# Patient Record
Sex: Male | Born: 1970
Health system: Southern US, Community
[De-identification: ages and names within clinical notes are randomized; demographics above are authoritative.]

## PROBLEM LIST (undated history)

## (undated) DIAGNOSIS — W3400XA Accidental discharge from unspecified firearms or gun, initial encounter: Secondary | ICD-10-CM

---

## 1989-10-17 DIAGNOSIS — W3400XA Accidental discharge from unspecified firearms or gun, initial encounter: Secondary | ICD-10-CM

## 1989-10-17 HISTORY — DX: Accidental discharge from unspecified firearms or gun, initial encounter: W34.00XA

## 2017-06-02 ENCOUNTER — Emergency Department (HOSPITAL_COMMUNITY): Payer: Self-pay

## 2017-06-02 ENCOUNTER — Emergency Department (HOSPITAL_COMMUNITY)
Admission: EM | Admit: 2017-06-02 | Discharge: 2017-06-02 | Disposition: A | Discharge status: Home / Self Care | Payer: Self-pay | Attending: Emergency Medicine | Admitting: Emergency Medicine

## 2017-06-02 DIAGNOSIS — M1711 Unilateral primary osteoarthritis, right knee: Secondary | ICD-10-CM | POA: Insufficient documentation

## 2017-06-02 DIAGNOSIS — M25461 Effusion, right knee: Secondary | ICD-10-CM | POA: Insufficient documentation

## 2017-06-02 MED ORDER — HYDROCODONE-ACETAMINOPHEN 5-325 MG PO TABS
1.0000 | ORAL_TABLET | Freq: Once | ORAL | Status: AC
  Administered 2017-06-02: 1 via ORAL
  Filled 2017-06-02: qty 1

## 2017-06-02 MED ORDER — HYDROCODONE-ACETAMINOPHEN 5-325 MG PO TABS: ORAL_TABLET | ORAL | 0 refills | Status: DC

## 2017-06-02 MED ORDER — IBUPROFEN 200 MG PO TABS
600.0000 mg | ORAL_TABLET | Freq: Once | ORAL | Status: AC
  Administered 2017-06-02: 600 mg via ORAL
  Filled 2017-06-02: qty 3

## 2017-06-02 NOTE — Discharge Instructions (Signed)
For pain control please take ibuprofen (also known as Motrin or Advil) 800mg (this is normally 4 over the counter pills) 3 times a day  for 5 days. Take with food to minimize stomach irritation. ° °Take vicodin for breakthrough pain, do not drink alcohol, drive, care for children or do other critical tasks while taking vicodin. ° °Do not hesitate to return to the Emergency Department for any new, worsening or concerning symptoms.  ° °If you do not have a primary care doctor you can establish one at the  ° °[facility]: °[address] °[place] [address] °[phone] ° °After you establish care. Let them know you were seen in the emergency room. They must obtain records for further management.  ° ° ° °

## 2017-06-02 NOTE — ED Notes (Signed)
Discharge instructions reviewed with patient. Patient verbalizes understanding. VSS.  Patient discharged with knee sleeve in place and with crutches.

## 2017-06-02 NOTE — ED Triage Notes (Signed)
Pt states that he has been having rt knee pain since last night at work.  Worse this morning.  Denies any injury.

## 2017-06-02 NOTE — ED Provider Notes (Signed)
[facility]-EMERGENCY DEPT Provider Note   CSN: [id number] Arrival date & time: 06/02/17  1606     History   Chief Complaint Chief Complaint  Patient presents with  . Knee Pain     HPI   Blood pressure (!) 160/91, pulse 86, temperature 99.1 F (37.3 C), temperature source Oral, resp. rate 18, SpO2 98 %.   Travis Livingston is a 46 y.o. male complaining ofAtraumatic knee pain worsening over the course of the day, taken over-the-counter pain medications with little relief he states the area is swollen but not read. No prior similar exacerbations. He has difficulty ambulating on the knee secondary to pain.   No past medical history on file.  There are no active problems to display for this patient.   No past surgical history on file.     Home Medications    Prior to Admission medications   Medication Sig Start Date End Date Taking? Authorizing Provider  HYDROcodone-acetaminophen (NORCO/VICODIN) 5-325 MG tablet Take 1-2 tablets by mouth every 6 hours as needed for pain. 06/02/17   Travis Livingston, Travis Livingston    Family History No family history on file.  Social History Social History  Substance Use Topics  . Smoking status: Not on file  . Smokeless tobacco: Not on file  . Alcohol use Not on file     Allergies   Patient has no allergy information on record.   Review of Systems Review of Systems  A complete review of systems was obtained and all systems are negative except as noted in the HPI and PMH.    Physical Exam Updated Vital Signs BP (!) 160/91 (BP Location: Left Arm)   Pulse 86   Temp 99.1 F (37.3 C) (Oral)   Resp 18   SpO2 98%   Physical Exam  Constitutional: He is oriented to person, place, and time. He appears well-developed and well-nourished. No distress.  HENT:  Head: Normocephalic and atraumatic.  Mouth/Throat: Oropharynx is clear and moist.  Eyes: Pupils are equal, round, and reactive to light. Conjunctivae and EOM are normal.  Neck: Normal  range of motion.  Cardiovascular: Normal rate, regular rhythm and intact distal pulses.   Pulmonary/Chest: Effort normal and breath sounds normal.  Abdominal: Soft. There is no tenderness.  Musculoskeletal: Normal range of motion. He exhibits edema.  Right knee:  No deformity, erythema or abrasions. Significant effusion with crepitance, very minimally reduced range of motion secondary to pain. No warmth. Anterior and posterior drawer show no abnormal laxity. Stable to valgus and varus stress. Joint lines are non-tender. Neurovascularly intact. Pt ambulates with antalgic gait.    Neurological: He is alert and oriented to person, place, and time.  Skin: He is not diaphoretic.  Psychiatric: He has a normal mood and affect.  Nursing note and vitals reviewed.    ED Treatments / Results  Labs (all labs ordered are listed, but only abnormal results are displayed) Labs Reviewed - No data to display  EKG  EKG Interpretation None       Radiology Dg Knee Complete 4 Views Right  Result Date: 06/02/2017 CLINICAL DATA:  Right knee pain. EXAM: RIGHT KNEE - COMPLETE 4+ VIEW COMPARISON:  No recent . FINDINGS: Moderate knee joint effusion. Tricompartment degenerative change present. Corticated bony density noted adjacent to the medial femoral condyles most likely from old ligamentous injury. IMPRESSION: 1.  Moderate knee joint effusion. 2. Tricompartment degenerative change. Corticated bony density noted adjacent to the medial femoral condyle most likely from old ligamentous  injury. Electronically Signed   By: Maisie Fus  Register   On: 06/02/2017 17:25    Procedures Procedures (including critical care time)  Medications Ordered in ED Medications  HYDROcodone-acetaminophen (NORCO/VICODIN) 5-325 MG per tablet 1 tablet (not administered)  ibuprofen (ADVIL,MOTRIN) tablet 600 mg (not administered)     Initial Impression / Assessment and Plan / ED Course  I have reviewed the triage vital signs and  the nursing notes.  Pertinent labs & imaging results that were available during my care of the patient were reviewed by me and considered in my medical decision making (see chart for details).     Vitals:   06/02/17 1630  BP: (!) 160/91  Pulse: 86  Resp: 18  Temp: 99.1 F (37.3 C)  TempSrc: Oral  SpO2: 98%    Medications  HYDROcodone-acetaminophen (NORCO/VICODIN) 5-325 MG per tablet 1 tablet (not administered)  ibuprofen (ADVIL,MOTRIN) tablet 600 mg (not administered)    Travis Livingston is 46 y.o. male presenting with Pain and effusion to her right knee, likely reactive secondary to osteoarthritis. No warmth or significant reduced range of motion to suggest a septic joint. Offered arthrocentesis for comfort; the patient declined. Patient will be given knee sleeve, crutches, work note and orthopedic referral.  Evaluation does not show pathology that would require ongoing emergent intervention or inpatient treatment. Pt is hemodynamically stable and mentating appropriately. Discussed findings and plan with patient/guardian, who agrees with care plan. All questions answered. Return precautions discussed and outpatient follow up given.      Final Clinical Impressions(s) / ED Diagnoses   Final diagnoses:  Arthritis of right knee  Effusion of right knee joint    New Prescriptions New Prescriptions   HYDROCODONE-ACETAMINOPHEN (NORCO/VICODIN) 5-325 MG TABLET    Take 1-2 tablets by mouth every 6 hours as needed for pain.     Kaylyn Lim 06/02/17 1809    Mesner, Barbara Cower, MD 06/02/17 [phone]

## 2017-07-27 ENCOUNTER — Encounter (HOSPITAL_COMMUNITY): Payer: Self-pay | Admitting: Radiology

## 2017-07-27 ENCOUNTER — Emergency Department (HOSPITAL_COMMUNITY)
Admission: EM | Admit: 2017-07-27 | Discharge: 2017-07-28 | Disposition: A | Discharge status: Home / Self Care | Payer: No Typology Code available for payment source | Attending: Emergency Medicine | Admitting: Emergency Medicine

## 2017-07-27 ENCOUNTER — Emergency Department (HOSPITAL_COMMUNITY): Payer: No Typology Code available for payment source

## 2017-07-27 DIAGNOSIS — R109 Unspecified abdominal pain: Secondary | ICD-10-CM | POA: Insufficient documentation

## 2017-07-27 DIAGNOSIS — M542 Cervicalgia: Secondary | ICD-10-CM | POA: Insufficient documentation

## 2017-07-27 DIAGNOSIS — R202 Paresthesia of skin: Secondary | ICD-10-CM | POA: Diagnosis not present

## 2017-07-27 DIAGNOSIS — R200 Anesthesia of skin: Secondary | ICD-10-CM | POA: Insufficient documentation

## 2017-07-27 DIAGNOSIS — R079 Chest pain, unspecified: Secondary | ICD-10-CM | POA: Diagnosis not present

## 2017-07-27 LAB — COMPREHENSIVE METABOLIC PANEL
ALK PHOS: 75 U/L (ref 38–126)
ALT: 24 U/L (ref 17–63)
ANION GAP: 10 (ref 5–15)
AST: 32 U/L (ref 15–41)
Albumin: 3.8 g/dL (ref 3.5–5.0)
BUN: 10 mg/dL (ref 6–20)
CALCIUM: 8.9 mg/dL (ref 8.9–10.3)
CO2: 25 mmol/L (ref 22–32)
Chloride: 103 mmol/L (ref 101–111)
Creatinine, Ser: 1 mg/dL (ref 0.61–1.24)
GFR calc non Af Amer: >60 mL/min (ref >60)
Glucose, Bld: 92 mg/dL (ref 65–99)
POTASSIUM: 3.1 mmol/L — AB (ref 3.5–5.1)
SODIUM: 138 mmol/L (ref 135–145)
Total Bilirubin: 0.4 mg/dL (ref 0.3–1.2)
Total Protein: 6.6 g/dL (ref 6.5–8.1)

## 2017-07-27 LAB — CBC
HCT: 36.3 % — ABNORMAL LOW (ref 39.0–52.0)
HEMOGLOBIN: 13.2 g/dL (ref 13.0–17.0)
MCH: 30.1 pg (ref 26.0–34.0)
MCHC: 36.4 g/dL — ABNORMAL HIGH (ref 30.0–36.0)
MCV: 82.7 fL (ref 78.0–100.0)
Platelets: 272 10*3/uL (ref 150–400)
RBC: 4.39 MIL/uL (ref 4.22–5.81)
RDW: 14 % (ref 11.5–15.5)
WBC: 6.5 10*3/uL (ref 4.0–10.5)

## 2017-07-27 LAB — URINALYSIS, ROUTINE W REFLEX MICROSCOPIC
BILIRUBIN URINE: NEGATIVE
Glucose, UA: NEGATIVE mg/dL
Hgb urine dipstick: NEGATIVE
KETONES UR: NEGATIVE mg/dL
Leukocytes, UA: NEGATIVE
NITRITE: NEGATIVE
PROTEIN: NEGATIVE mg/dL
Specific Gravity, Urine: 1.015 (ref 1.005–1.030)
pH: 8 (ref 5.0–8.0)

## 2017-07-27 LAB — I-STAT CHEM 8, ED
BUN: 10 mg/dL (ref 6–20)
CALCIUM ION: 1.05 mmol/L — AB (ref 1.15–1.40)
CHLORIDE: 102 mmol/L (ref 101–111)
Creatinine, Ser: 0.9 mg/dL (ref 0.61–1.24)
GLUCOSE: 94 mg/dL (ref 65–99)
HCT: 39 % (ref 39.0–52.0)
Hemoglobin: 13.3 g/dL (ref 13.0–17.0)
Potassium: 3 mmol/L — ABNORMAL LOW (ref 3.5–5.1)
Sodium: 142 mmol/L (ref 135–145)
TCO2: 24 mmol/L (ref 22–32)

## 2017-07-27 LAB — PROTIME-INR
INR: 0.98
PROTHROMBIN TIME: 12.9 s (ref 11.4–15.2)

## 2017-07-27 LAB — ETHANOL

## 2017-07-27 MED ORDER — FENTANYL CITRATE (PF) 100 MCG/2ML IJ SOLN
50.0000 ug | Freq: Once | INTRAMUSCULAR | Status: AC
  Administered 2017-07-27: 50 ug via INTRAVENOUS
  Filled 2017-07-27: qty 2

## 2017-07-27 MED ORDER — IOPAMIDOL (ISOVUE-300) INJECTION 61%
INTRAVENOUS | Status: AC
  Administered 2017-07-27: 100 mL
  Filled 2017-07-27: qty 100

## 2017-07-27 MED ORDER — HYDROCODONE-ACETAMINOPHEN 5-325 MG PO TABS: ORAL_TABLET | ORAL | 0 refills | Status: AC
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
## 2018-06-17 ENCOUNTER — Encounter (HOSPITAL_COMMUNITY): Admission: EM | Disposition: A | Discharge status: Home / Self Care | Payer: Self-pay | Source: Home / Self Care

## 2018-06-17 ENCOUNTER — Inpatient Hospital Stay (HOSPITAL_COMMUNITY)
Admission: EM | Admit: 2018-06-17 | Discharge: 2018-06-19 | DRG: 481 | Disposition: A | Discharge status: Home / Self Care | Payer: Self-pay | Attending: Surgery | Admitting: Surgery

## 2018-06-17 ENCOUNTER — Emergency Department (HOSPITAL_COMMUNITY): Payer: Self-pay

## 2018-06-17 ENCOUNTER — Inpatient Hospital Stay (HOSPITAL_COMMUNITY): Payer: Self-pay | Admitting: Certified Registered Nurse Anesthetist

## 2018-06-17 ENCOUNTER — Inpatient Hospital Stay (HOSPITAL_COMMUNITY): Payer: Self-pay

## 2018-06-17 ENCOUNTER — Other Ambulatory Visit: Payer: Self-pay

## 2018-06-17 ENCOUNTER — Encounter (HOSPITAL_COMMUNITY): Payer: Self-pay | Admitting: *Deleted

## 2018-06-17 DIAGNOSIS — R402252 Coma scale, best verbal response, oriented, at arrival to emergency department: Secondary | ICD-10-CM | POA: Diagnosis present

## 2018-06-17 DIAGNOSIS — S71132A Puncture wound without foreign body, left thigh, initial encounter: Secondary | ICD-10-CM

## 2018-06-17 DIAGNOSIS — F17200 Nicotine dependence, unspecified, uncomplicated: Secondary | ICD-10-CM | POA: Diagnosis present

## 2018-06-17 DIAGNOSIS — W3400XA Accidental discharge from unspecified firearms or gun, initial encounter: Secondary | ICD-10-CM

## 2018-06-17 DIAGNOSIS — S72352B Displaced comminuted fracture of shaft of left femur, initial encounter for open fracture type I or II: Principal | ICD-10-CM | POA: Diagnosis present

## 2018-06-17 DIAGNOSIS — D62 Acute posthemorrhagic anemia: Secondary | ICD-10-CM | POA: Diagnosis not present

## 2018-06-17 DIAGNOSIS — R402142 Coma scale, eyes open, spontaneous, at arrival to emergency department: Secondary | ICD-10-CM | POA: Diagnosis present

## 2018-06-17 DIAGNOSIS — Y92810 Car as the place of occurrence of the external cause: Secondary | ICD-10-CM

## 2018-06-17 DIAGNOSIS — Z09 Encounter for follow-up examination after completed treatment for conditions other than malignant neoplasm: Secondary | ICD-10-CM

## 2018-06-17 DIAGNOSIS — R402362 Coma scale, best motor response, obeys commands, at arrival to emergency department: Secondary | ICD-10-CM | POA: Diagnosis present

## 2018-06-17 HISTORY — PX: INTRAMEDULLARY (IM) NAIL INTERTROCHANTERIC: SHX5875

## 2018-06-17 HISTORY — DX: Accidental discharge from unspecified firearms or gun, initial encounter: W34.00XA

## 2018-06-17 LAB — TYPE AND SCREEN
ABO/RH(D): O POS
Antibody Screen: NEGATIVE
UNIT DIVISION: 0
Unit division: 0

## 2018-06-17 LAB — I-STAT CHEM 8, ED
BUN: 9 mg/dL (ref 6–20)
CALCIUM ION: 1.04 mmol/L — AB (ref 1.15–1.40)
Chloride: 103 mmol/L (ref 98–111)
Creatinine, Ser: 1.5 mg/dL — ABNORMAL HIGH (ref 0.61–1.24)
Glucose, Bld: 174 mg/dL — ABNORMAL HIGH (ref 70–99)
HEMATOCRIT: 37 % — AB (ref 39.0–52.0)
HEMOGLOBIN: 12.6 g/dL — AB (ref 13.0–17.0)
Potassium: 3 mmol/L — ABNORMAL LOW (ref 3.5–5.1)
SODIUM: 140 mmol/L (ref 135–145)
TCO2: 20 mmol/L — ABNORMAL LOW (ref 22–32)

## 2018-06-17 LAB — BPAM RBC
BLOOD PRODUCT EXPIRATION DATE: 201909202359
Blood Product Expiration Date: 201909232359
ISSUE DATE / TIME: 201909010333
ISSUE DATE / TIME: 201909010333
UNIT TYPE AND RH: 9500
Unit Type and Rh: 9500

## 2018-06-17 LAB — PREPARE FRESH FROZEN PLASMA
UNIT DIVISION: 0
Unit division: 0

## 2018-06-17 LAB — BPAM FFP
BLOOD PRODUCT EXPIRATION DATE: 201909022359
BLOOD PRODUCT EXPIRATION DATE: 201909032359
ISSUE DATE / TIME: 201909010335
ISSUE DATE / TIME: 201909010335
UNIT TYPE AND RH: 600
Unit Type and Rh: 6200

## 2018-06-17 LAB — BLOOD PRODUCT ORDER (VERBAL) VERIFICATION

## 2018-06-17 LAB — CDS SEROLOGY

## 2018-06-17 LAB — ABO/RH: ABO/RH(D): O POS

## 2018-06-17 LAB — MRSA PCR SCREENING: MRSA BY PCR: NEGATIVE

## 2018-06-17 LAB — I-STAT CG4 LACTIC ACID, ED: Lactic Acid, Venous: 6.46 mmol/L (ref 0.5–1.9)

## 2018-06-17 SURGERY — FIXATION, FRACTURE, INTERTROCHANTERIC, WITH INTRAMEDULLARY ROD
Anesthesia: General | Laterality: Left

## 2018-06-17 MED ORDER — PHENOL 1.4 % MT LIQD: 1.0000 | OROMUCOSAL | Status: DC | PRN

## 2018-06-17 MED ORDER — FENTANYL CITRATE (PF) 100 MCG/2ML IJ SOLN
INTRAMUSCULAR | Status: AC
  Filled 2018-06-17: qty 2

## 2018-06-17 MED ORDER — SUGAMMADEX SODIUM 200 MG/2ML IV SOLN
INTRAVENOUS | Status: DC | PRN
  Administered 2018-06-17: 170 mg via INTRAVENOUS

## 2018-06-17 MED ORDER — SODIUM CHLORIDE 0.9 % IV SOLN
INTRAVENOUS | Status: DC
  Administered 2018-06-17 – 2018-06-18 (×4): via INTRAVENOUS

## 2018-06-17 MED ORDER — HYDROMORPHONE HCL 1 MG/ML IJ SOLN
1.0000 mg | INTRAMUSCULAR | Status: DC | PRN
  Administered 2018-06-17 – 2018-06-18 (×4): 1 mg via INTRAVENOUS
  Filled 2018-06-17 (×4): qty 1

## 2018-06-17 MED ORDER — CEFAZOLIN SODIUM-DEXTROSE 2-4 GM/100ML-% IV SOLN
2.0000 g | Freq: Four times a day (QID) | INTRAVENOUS | Status: AC
  Administered 2018-06-17 – 2018-06-18 (×2): 2 g via INTRAVENOUS
  Filled 2018-06-17 (×2): qty 100

## 2018-06-17 MED ORDER — HYDROMORPHONE HCL 1 MG/ML IJ SOLN: 0.2500 mg | INTRAMUSCULAR | Status: DC | PRN

## 2018-06-17 MED ORDER — ENOXAPARIN SODIUM 40 MG/0.4ML ~~LOC~~ SOLN
40.0000 mg | Freq: Every day | SUBCUTANEOUS | Status: DC
  Administered 2018-06-18 – 2018-06-19 (×2): 40 mg via SUBCUTANEOUS
  Filled 2018-06-17 (×2): qty 0.4

## 2018-06-17 MED ORDER — ONDANSETRON HCL 4 MG/2ML IJ SOLN
INTRAMUSCULAR | Status: AC
  Filled 2018-06-17: qty 2

## 2018-06-17 MED ORDER — HYDROMORPHONE HCL 1 MG/ML IJ SOLN
1.0000 mg | Freq: Once | INTRAMUSCULAR | Status: AC
  Administered 2018-06-17: 1 mg via INTRAVENOUS
  Filled 2018-06-17: qty 1

## 2018-06-17 MED ORDER — ONDANSETRON HCL 4 MG/2ML IJ SOLN
4.0000 mg | Freq: Four times a day (QID) | INTRAMUSCULAR | Status: DC | PRN
  Administered 2018-06-17: 4 mg via INTRAVENOUS

## 2018-06-17 MED ORDER — ROCURONIUM BROMIDE 50 MG/5ML IV SOSY
PREFILLED_SYRINGE | INTRAVENOUS | Status: AC
  Filled 2018-06-17: qty 5

## 2018-06-17 MED ORDER — IOPAMIDOL (ISOVUE-370) INJECTION 76%
100.0000 mL | Freq: Once | INTRAVENOUS | Status: AC | PRN
  Administered 2018-06-17: 100 mL via INTRAVENOUS

## 2018-06-17 MED ORDER — LIDOCAINE HCL (CARDIAC) PF 100 MG/5ML IV SOSY
PREFILLED_SYRINGE | INTRAVENOUS | Status: DC | PRN
  Administered 2018-06-17: 60 mg via INTRAVENOUS

## 2018-06-17 MED ORDER — DOCUSATE SODIUM 100 MG PO CAPS
100.0000 mg | ORAL_CAPSULE | Freq: Two times a day (BID) | ORAL | Status: DC
  Administered 2018-06-17 – 2018-06-19 (×4): 100 mg via ORAL
  Filled 2018-06-17 (×4): qty 1

## 2018-06-17 MED ORDER — 0.9 % SODIUM CHLORIDE (POUR BTL) OPTIME
TOPICAL | Status: DC | PRN
  Administered 2018-06-17: 1000 mL

## 2018-06-17 MED ORDER — ONDANSETRON 4 MG PO TBDP: 4.0000 mg | ORAL_TABLET | Freq: Four times a day (QID) | ORAL | Status: DC | PRN

## 2018-06-17 MED ORDER — FENTANYL CITRATE (PF) 250 MCG/5ML IJ SOLN
INTRAMUSCULAR | Status: AC
  Filled 2018-06-17: qty 5

## 2018-06-17 MED ORDER — FENTANYL CITRATE (PF) 100 MCG/2ML IJ SOLN
INTRAMUSCULAR | Status: DC | PRN
  Administered 2018-06-17 (×2): 50 ug via INTRAVENOUS
  Administered 2018-06-17: 150 ug via INTRAVENOUS

## 2018-06-17 MED ORDER — HYDROMORPHONE HCL 1 MG/ML IJ SOLN
0.5000 mg | INTRAMUSCULAR | Status: DC | PRN
  Administered 2018-06-17: 0.5 mg via INTRAVENOUS
  Filled 2018-06-17: qty 1

## 2018-06-17 MED ORDER — MIDAZOLAM HCL 5 MG/5ML IJ SOLN
INTRAMUSCULAR | Status: DC | PRN
  Administered 2018-06-17: 2 mg via INTRAVENOUS

## 2018-06-17 MED ORDER — MENTHOL 3 MG MT LOZG: 1.0000 | LOZENGE | OROMUCOSAL | Status: DC | PRN

## 2018-06-17 MED ORDER — SODIUM CHLORIDE 0.9 % IV SOLN
Freq: Once | INTRAVENOUS | Status: AC
  Administered 2018-06-17: 04:00:00 via INTRAVENOUS

## 2018-06-17 MED ORDER — HYDROMORPHONE HCL 1 MG/ML IJ SOLN
INTRAMUSCULAR | Status: AC
  Filled 2018-06-17: qty 1

## 2018-06-17 MED ORDER — DEXAMETHASONE SODIUM PHOSPHATE 10 MG/ML IJ SOLN
INTRAMUSCULAR | Status: DC | PRN
  Administered 2018-06-17: 10 mg via INTRAVENOUS

## 2018-06-17 MED ORDER — OXYCODONE HCL 5 MG PO TABS
10.0000 mg | ORAL_TABLET | ORAL | Status: DC | PRN
  Administered 2018-06-18 – 2018-06-19 (×2): 10 mg via ORAL
  Filled 2018-06-17 (×4): qty 2

## 2018-06-17 MED ORDER — CEFAZOLIN SODIUM-DEXTROSE 1-4 GM/50ML-% IV SOLN
1.0000 g | Freq: Three times a day (TID) | INTRAVENOUS | Status: DC
  Administered 2018-06-17 (×2): 1 g via INTRAVENOUS
  Filled 2018-06-17 (×3): qty 50

## 2018-06-17 MED ORDER — OXYCODONE HCL 5 MG PO TABS: 5.0000 mg | ORAL_TABLET | Freq: Once | ORAL | Status: DC | PRN

## 2018-06-17 MED ORDER — LACTATED RINGERS IV SOLN
INTRAVENOUS | Status: DC
  Administered 2018-06-17 (×2): via INTRAVENOUS

## 2018-06-17 MED ORDER — MIDAZOLAM HCL 2 MG/2ML IJ SOLN
INTRAMUSCULAR | Status: AC
  Filled 2018-06-17: qty 2

## 2018-06-17 MED ORDER — OXYCODONE HCL 5 MG/5ML PO SOLN: 5.0000 mg | Freq: Once | ORAL | Status: DC | PRN

## 2018-06-17 MED ORDER — CEFAZOLIN SODIUM-DEXTROSE 2-3 GM-%(50ML) IV SOLR
INTRAVENOUS | Status: DC | PRN
  Administered 2018-06-17: 2 g via INTRAVENOUS

## 2018-06-17 MED ORDER — ENOXAPARIN SODIUM 40 MG/0.4ML ~~LOC~~ SOLN: 40.0000 mg | Freq: Every day | SUBCUTANEOUS | Status: DC

## 2018-06-17 MED ORDER — OXYCODONE HCL 5 MG PO TABS: 5.0000 mg | ORAL_TABLET | ORAL | Status: DC | PRN

## 2018-06-17 MED ORDER — PROPOFOL 10 MG/ML IV BOLUS
INTRAVENOUS | Status: DC | PRN
  Administered 2018-06-17: 140 mg via INTRAVENOUS

## 2018-06-17 MED ORDER — FENTANYL CITRATE (PF) 100 MCG/2ML IJ SOLN
INTRAMUSCULAR | Status: AC | PRN
  Administered 2018-06-17: 50 ug via INTRAVENOUS

## 2018-06-17 MED ORDER — ROCURONIUM BROMIDE 50 MG/5ML IV SOSY
PREFILLED_SYRINGE | INTRAVENOUS | Status: DC | PRN
  Administered 2018-06-17: 50 mg via INTRAVENOUS
  Administered 2018-06-17: 10 mg via INTRAVENOUS

## 2018-06-17 SURGICAL SUPPLY — 47 items
BIT DRILL 4.3 FREE (DRILL) IMPLANT
BIT DRILL CALIBRTD FREE HND4.3 (BIT) IMPLANT
BIT DRILL CALIBRTD SHORT 4.9MM (BIT) IMPLANT
BIT DRILL SHORT CALI 4.9 CANN (DRILL) IMPLANT
BNDG COHESIVE 6X5 TAN STRL LF (GAUZE/BANDAGES/DRESSINGS) ×2 IMPLANT
BNDG GAUZE ELAST 4 BULKY (GAUZE/BANDAGES/DRESSINGS) ×3 IMPLANT
CLOSURE STERI-STRIP 1/2X4 (GAUZE/BANDAGES/DRESSINGS) ×1
CLSR STERI-STRIP ANTIMIC 1/2X4 (GAUZE/BANDAGES/DRESSINGS) ×1 IMPLANT
COVER PERINEAL POST (MISCELLANEOUS) ×3 IMPLANT
COVER SURGICAL LIGHT HANDLE (MISCELLANEOUS) ×3 IMPLANT
DRILL 4.3 FREE (DRILL) ×3
DRILL CALIBRATED FREE HAND 4.3 (BIT) ×3
DRILL CALIBRATED SHORT 4.9MM (BIT) ×3
DRILL SHORT CALI 4.9 CANN (DRILL) ×3
DRSG AQUACEL AG ADV 3.5X 4 (GAUZE/BANDAGES/DRESSINGS) ×6 IMPLANT
DURAPREP 26ML APPLICATOR (WOUND CARE) ×3 IMPLANT
ELECT REM PT RETURN 9FT ADLT (ELECTROSURGICAL) ×3
ELECTRODE REM PT RTRN 9FT ADLT (ELECTROSURGICAL) ×1 IMPLANT
GAUZE SPONGE 4X4 12PLY STRL (GAUZE/BANDAGES/DRESSINGS) ×2 IMPLANT
GLOVE BIOGEL PI IND STRL 8 (GLOVE) ×1 IMPLANT
GLOVE BIOGEL PI INDICATOR 8 (GLOVE) ×2
GLOVE BIOGEL PI ORTHO PRO SZ8 (GLOVE) ×2
GLOVE ECLIPSE 8.0 STRL XLNG CF (GLOVE) ×6 IMPLANT
GLOVE PI ORTHO PRO STRL SZ8 (GLOVE) IMPLANT
GLOVE SURG ORTHO 8.0 STRL STRW (GLOVE) ×2 IMPLANT
GOWN STRL REUS W/ TWL LRG LVL3 (GOWN DISPOSABLE) ×2 IMPLANT
GOWN STRL REUS W/TWL 2XL LVL3 (GOWN DISPOSABLE) ×2 IMPLANT
GOWN STRL REUS W/TWL LRG LVL3 (GOWN DISPOSABLE) ×6
GUIDEWIRE BALL NOSE 100CM (WIRE) ×2 IMPLANT
KIT TURNOVER KIT B (KITS) ×3 IMPLANT
NAIL FEM ANT IM 11X380 (Nail) ×2 IMPLANT
NS IRRIG 1000ML POUR BTL (IV SOLUTION) ×3 IMPLANT
PACK GENERAL/GYN (CUSTOM PROCEDURE TRAY) ×3 IMPLANT
PAD ABD 8X10 STRL (GAUZE/BANDAGES/DRESSINGS) ×4 IMPLANT
PAD ARMBOARD 7.5X6 YLW CONV (MISCELLANEOUS) ×5 IMPLANT
PIN GUIDE 3.0 THREADED (PIN) ×6 IMPLANT
SCREW BONE 5.0X37.5MM CORT Z (Screw) ×2 IMPLANT
SCREW CORTICAL HEXAGON 5.0X40 (Screw) ×2 IMPLANT
SCREW FEM FA STD 6X85 (Screw) ×4 IMPLANT
SCREW HEX HEAD 3.5X42.5 (Screw) ×2 IMPLANT
SUT MNCRL AB 4-0 PS2 18 (SUTURE) ×3 IMPLANT
SUT VIC AB 2-0 CT1 27 (SUTURE) ×3
SUT VIC AB 2-0 CT1 TAPERPNT 27 (SUTURE) ×1 IMPLANT
SUT VIC AB 3-0 CT1 27 (SUTURE) ×3
SUT VIC AB 3-0 CT1 TAPERPNT 27 (SUTURE) IMPLANT
TOWEL OR 17X24 6PK STRL BLUE (TOWEL DISPOSABLE) ×3 IMPLANT
TOWEL OR 17X26 10 PK STRL BLUE (TOWEL DISPOSABLE) ×3 IMPLANT
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
## 2018-06-17 NOTE — Transfer of Care (Signed)
Immediate Anesthesia Transfer of Care Note  Patient: Travis Livingston  Procedure(s) Performed: INTRAMEDULLARY (IM) NAIL INTERTROCHANTRIC (Left )  Patient Location: PACU  Anesthesia Type:General  Level of Consciousness: awake, alert , oriented and patient cooperative  Airway & Oxygen Therapy: Patient Spontanous Breathing and Patient connected to nasal cannula oxygen  Post-op Assessment: Report given to RN and Post -op Vital signs reviewed and stable  Post vital signs: Reviewed and stable  Last Vitals: 128/79, 113, 18, 96% 3L [place] Vitals Value Taken Time  BP 128/79 06/17/2018  4:36 PM  Temp    Pulse 114 06/17/2018  4:38 PM  Resp 16 06/17/2018  4:38 PM  SpO2 87 % 06/17/2018  4:38 PM  Vitals shown include unvalidated device data.  Last Pain:  Vitals:   06/17/18 0900  PainSc: 4          Complications: No apparent anesthesia complications
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
## 2018-06-17 NOTE — Plan of Care (Signed)
  Problem: Education: Goal: Knowledge of General Education information will improve Description: Including pain rating scale, medication(s)/side effects and non-pharmacologic comfort measures Outcome: Progressing   Problem: Clinical Measurements: Goal: Ability to maintain clinical measurements within normal limits will improve Outcome: Progressing Goal: Will remain free from infection Outcome: Progressing   
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
## 2018-06-17 NOTE — Anesthesia Procedure Notes (Signed)
Procedure Name: Intubation Date/Time: 06/17/2018 2:21 PM Performed by: Oletta Lamas, CRNA Pre-anesthesia Checklist: Patient identified, Emergency Drugs available, Suction available and Patient being monitored Patient Re-evaluated:Patient Re-evaluated prior to induction Oxygen Delivery Method: Circle System Utilized Preoxygenation: Pre-oxygenation with 100% oxygen Induction Type: IV induction Ventilation: Mask ventilation without difficulty Laryngoscope Size: Mac, 4 and Glidescope Grade View: Grade III Tube type: Oral Tube size: 7.5 mm Number of attempts: 1 Airway Equipment and Method: Stylet Placement Confirmation: ETT inserted through vocal cords under direct vision,  positive ETCO2 and breath sounds checked- equal and bilateral Secured at: 23 cm Tube secured with: Tape Dental Injury: Teeth and Oropharynx as per pre-operative assessment  Comments: Top right incisor noted to be extremely loose during pre-op assessment.  Dental advisory given in short stay.  Laryngoscopy yielded a grade 3 view.  Blood noted to bottom of loose incisor after first laryngoscopy.  Tooth remains intact but loose.  Converted to Glide scope intubation.   Marland Kitchen

## 2018-06-17 NOTE — ED Provider Notes (Signed)
Emergency Ultrasound Study:   Angiocath insertion Performed by: Nira Conn  Consent: Verbal consent obtained. Risks and benefits: risks, benefits and alternatives were discussed Immediately prior to procedure the correct patient, procedure, equipment, support staff and site/side marked as needed.  Indication: difficult IV access Preparation: Patient and probe were prepped with chlorhexidine or alcohol. Sterile gel used. Vein Location: left basilic vein was visualized during assessment for potential access sites and was found to be patent/ easily compressed with linear ultrasound.  1 inch, 20 gauge needle was visualized with real-time ultrasound and guided into the vein.  Image not saved due to machine malfunction Normal blood return.  Patient tolerance: Patient tolerated the procedure well with no immediate complications.         Nira Conn, MD 06/17/18 [id number]

## 2018-06-17 NOTE — Progress Notes (Signed)
Orthopedic Tech Progress Note Patient Details:  Travis Livingston [birth year] [id number]  Musculoskeletal Traction Type of Traction: Bucks Skin Traction Traction Location: lle Traction Weight: 10 lbs   Post Interventions Patient Tolerated: Well Instructions Provided: Care of device   Nikki Dom 06/17/2018, 9:54 AM
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
## 2018-06-18 NOTE — Anesthesia Postprocedure Evaluation (Signed)
Anesthesia Post Note  Patient: Travis Livingston  Procedure(s) Performed: INTRAMEDULLARY (IM) NAIL INTERTROCHANTRIC (Left )     Patient location during evaluation: PACU Anesthesia Type: General Level of consciousness: awake and alert Pain management: pain level controlled Vital Signs Assessment: post-procedure vital signs reviewed and stable Respiratory status: spontaneous breathing, nonlabored ventilation, respiratory function stable and patient connected to nasal cannula oxygen Cardiovascular status: blood pressure returned to baseline and stable Postop Assessment: no apparent nausea or vomiting Anesthetic complications: no    Last Vitals:  Vitals:   06/18/18 0013 06/18/18 0431  BP: (!) 160/94 (!) 152/92  Pulse: 93 87  Resp: 16 16  Temp: 37.1 C 37.2 C  SpO2: 100% 99%    Last Pain:  Vitals:   06/18/18 0431  TempSrc: Oral  PainSc:                  Sharlot Sturkey
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
## 2018-06-19 LAB — CBC
HCT: 36.7 % — ABNORMAL LOW (ref 39.0–52.0)
HCT: 30.9 % — ABNORMAL LOW (ref 39.0–52.0)
HCT: 23.8 % — ABNORMAL LOW (ref 39.0–52.0)
HEMATOCRIT: 25.6 % — AB (ref 39.0–52.0)
HEMOGLOBIN: 12.1 g/dL — AB (ref 13.0–17.0)
HEMOGLOBIN: 8 g/dL — AB (ref 13.0–17.0)
Hemoglobin: 10 g/dL — ABNORMAL LOW (ref 13.0–17.0)
Hemoglobin: 8.7 g/dL — ABNORMAL LOW (ref 13.0–17.0)
MCH: 29 pg (ref 26.0–34.0)
MCH: 29.3 pg (ref 26.0–34.0)
MCH: 29.3 pg (ref 26.0–34.0)
MCH: 28.8 pg (ref 26.0–34.0)
MCHC: 33 g/dL (ref 30.0–36.0)
MCHC: 32.4 g/dL (ref 30.0–36.0)
MCHC: 34 g/dL (ref 30.0–36.0)
MCHC: 33.6 g/dL (ref 30.0–36.0)
MCV: 88 fL (ref 78.0–100.0)
MCV: 90.6 fL (ref 78.0–100.0)
MCV: 86.2 fL (ref 78.0–100.0)
MCV: 85.6 fL (ref 78.0–100.0)
PLATELETS: 195 10*3/uL (ref 150–400)
Platelets: 277 10*3/uL (ref 150–400)
Platelets: 238 10*3/uL (ref 150–400)
Platelets: 201 10*3/uL (ref 150–400)
RBC: 4.17 MIL/uL — ABNORMAL LOW (ref 4.22–5.81)
RBC: 3.41 MIL/uL — ABNORMAL LOW (ref 4.22–5.81)
RBC: 2.97 MIL/uL — AB (ref 4.22–5.81)
RBC: 2.78 MIL/uL — AB (ref 4.22–5.81)
RDW: 13.6 % (ref 11.5–15.5)
RDW: 13.8 % (ref 11.5–15.5)
RDW: 13.3 % (ref 11.5–15.5)
RDW: 13 % (ref 11.5–15.5)
WBC: 7.7 10*3/uL (ref 4.0–10.5)
WBC: 13.8 10*3/uL — AB (ref 4.0–10.5)
WBC: 10 10*3/uL (ref 4.0–10.5)
WBC: 9.1 10*3/uL (ref 4.0–10.5)

## 2018-06-19 LAB — COMPREHENSIVE METABOLIC PANEL
ALBUMIN: 3.7 g/dL (ref 3.5–5.0)
ALT: 19 U/L (ref 0–44)
ANION GAP: 14 (ref 5–15)
AST: 29 U/L (ref 15–41)
Alkaline Phosphatase: 64 U/L (ref 38–126)
BILIRUBIN TOTAL: 0.9 mg/dL (ref 0.3–1.2)
BUN: 9 mg/dL (ref 6–20)
CHLORIDE: 104 mmol/L (ref 98–111)
CO2: 21 mmol/L — ABNORMAL LOW (ref 22–32)
Calcium: 8.6 mg/dL — ABNORMAL LOW (ref 8.9–10.3)
Creatinine, Ser: 1.55 mg/dL — ABNORMAL HIGH (ref 0.61–1.24)
Glucose, Bld: 181 mg/dL — ABNORMAL HIGH (ref 70–99)
POTASSIUM: 3.2 mmol/L — AB (ref 3.5–5.1)
Sodium: 139 mmol/L (ref 135–145)
Total Protein: 6.4 g/dL — ABNORMAL LOW (ref 6.5–8.1)

## 2018-06-19 LAB — BASIC METABOLIC PANEL
ANION GAP: 5 (ref 5–15)
Anion gap: 10 (ref 5–15)
Anion gap: 4 — ABNORMAL LOW (ref 5–15)
BUN: 9 mg/dL (ref 6–20)
BUN: <5 mg/dL — ABNORMAL LOW (ref 6–20)
BUN: <5 mg/dL — ABNORMAL LOW (ref 6–20)
CHLORIDE: 108 mmol/L (ref 98–111)
CHLORIDE: 104 mmol/L (ref 98–111)
CO2: 23 mmol/L (ref 22–32)
CO2: 29 mmol/L (ref 22–32)
CO2: 30 mmol/L (ref 22–32)
CREATININE: 0.94 mg/dL (ref 0.61–1.24)
Calcium: 7.9 mg/dL — ABNORMAL LOW (ref 8.9–10.3)
Calcium: 7.8 mg/dL — ABNORMAL LOW (ref 8.9–10.3)
Calcium: 8 mg/dL — ABNORMAL LOW (ref 8.9–10.3)
Chloride: 108 mmol/L (ref 98–111)
Creatinine, Ser: 1.15 mg/dL (ref 0.61–1.24)
Creatinine, Ser: 0.89 mg/dL (ref 0.61–1.24)
GLUCOSE: 120 mg/dL — AB (ref 70–99)
GLUCOSE: 115 mg/dL — AB (ref 70–99)
Glucose, Bld: 115 mg/dL — ABNORMAL HIGH (ref 70–99)
POTASSIUM: 3.6 mmol/L (ref 3.5–5.1)
Potassium: 4 mmol/L (ref 3.5–5.1)
Potassium: 3.8 mmol/L (ref 3.5–5.1)
SODIUM: 141 mmol/L (ref 135–145)
SODIUM: 139 mmol/L (ref 135–145)
Sodium: 141 mmol/L (ref 135–145)

## 2018-06-19 LAB — POCT I-STAT, CHEM 8
BUN: 9 mg/dL (ref 6–20)
CREATININE: 1.5 mg/dL — AB (ref 0.61–1.24)
Calcium, Ion: 1.04 mmol/L — ABNORMAL LOW (ref 1.15–1.40)
Chloride: 103 mmol/L (ref 98–111)
Glucose, Bld: 174 mg/dL — ABNORMAL HIGH (ref 70–99)
HEMATOCRIT: 37 % — AB (ref 39.0–52.0)
HEMOGLOBIN: 12.6 g/dL — AB (ref 13.0–17.0)
Potassium: 3 mmol/L — ABNORMAL LOW (ref 3.5–5.1)
Sodium: 140 mmol/L (ref 135–145)
TCO2: 20 mmol/L — AB (ref 22–32)

## 2018-06-19 LAB — ETHANOL: Alcohol, Ethyl (B): <10 mg/dL (ref <10)

## 2018-06-19 LAB — CG4 I-STAT (LACTIC ACID): Lactic Acid, Venous: 6.46 mmol/L (ref 0.5–1.9)

## 2018-06-19 LAB — PROTIME-INR
INR: 1.05
PROTHROMBIN TIME: 13.6 s (ref 11.4–15.2)

## 2018-06-19 LAB — HIV ANTIBODY (ROUTINE TESTING W REFLEX): HIV SCREEN 4TH GENERATION: NONREACTIVE

## 2018-06-19 MED ORDER — HYDROMORPHONE HCL 1 MG/ML IJ SOLN: 0.5000 mg | INTRAMUSCULAR | Status: DC | PRN

## 2018-06-19 MED ORDER — ACETAMINOPHEN 325 MG PO TABS: 1000.0000 mg | ORAL_TABLET | Freq: Four times a day (QID) | ORAL | Status: AC | PRN

## 2018-06-19 MED ORDER — ACETAMINOPHEN 325 MG PO TABS: 650.0000 mg | ORAL_TABLET | Freq: Four times a day (QID) | ORAL | Status: DC | PRN

## 2018-06-19 MED ORDER — ENOXAPARIN SODIUM 40 MG/0.4ML ~~LOC~~ SOLN: 40.0000 mg | Freq: Every day | SUBCUTANEOUS | 0 refills | Status: AC

## 2018-06-19 MED ORDER — OXYCODONE HCL 10 MG PO TABS: 5.0000 mg | ORAL_TABLET | ORAL | 0 refills | Status: AC | PRN

## 2018-06-19 MED ORDER — DOCUSATE SODIUM 100 MG PO CAPS: 100.0000 mg | ORAL_CAPSULE | Freq: Two times a day (BID) | ORAL | 0 refills | Status: AC
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
## 2018-06-19 NOTE — Progress Notes (Signed)
Discharge instrcutions completed with pt.  Pt verbalized understanding of the information.  Pt denies chest pain, shortness of breath, dizziness, lightheadedness, and n/v.  Pt's IV discontinued.  Pt discharged home.

## 2018-06-19 NOTE — Progress Notes (Signed)
OT Cancellation Note  Patient Details Name: Travis Livingston MRN: [id number] DOB: January 17, 1971   Cancelled Treatment:    Reason Eval/Treat Not Completed: Other (comment); pt currently on the phone upon entering room, politely acknowledging therapist but making no attempts to end phone call. Agreeable to OT checking back at a later time today. Will follow up as able.   Marcy Siren, OT Pager [phone] 06/19/2018   [name] 06/19/2018, 1:24 PM

## 2018-06-19 NOTE — Discharge Planning (Signed)
EDCM enrolled pt in [facility] program and placed override for co-pay.
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
## 2018-06-19 NOTE — Care Management Note (Signed)
Case Management Note  Patient Details  Name: Travis Livingston MRN: [id number] Date of Birth: [birth year]  Subjective/Objective:                    Action/Plan: Case manager provided patietn with MATCH letter for pain medication and lovenox.    Expected Discharge Date:   06/19/18               Expected Discharge Plan:  Home/Self Care  In-House Referral:     Discharge planning Services  CM Consult, MATCH Program  Post Acute Care Choice:  Durable Medical Equipment Choice offered to:     DME Arranged:  Walker rolling, 3-N-1 DME Agency:  [facility]  HH Arranged:    [facility] Agency:     Status of Service:  Completed, signed off  If discussed at Long Length of Stay Meetings, dates discussed:    Additional Comments:  Durenda Guthrie, RN 06/19/2018, 3:11 PM

## 2018-06-20 ENCOUNTER — Encounter (HOSPITAL_COMMUNITY): Payer: Self-pay | Admitting: Orthopaedic Surgery

## 2018-06-21 NOTE — Care Management (Signed)
ED CM received call from inpatient CM concerning [facility] unable to process [facility] letter for medication assistance. ED CM reviewed MATCH information information is input correctly CM attempted to contact [facility] spoke with Misty Stanley who states, she is unable to find patient in the system. CM attempted to contact patiient to find out name of pharmacy LVM for a return call to further assist with obtaining medications. CM will make 2nd attempt late today

## 2018-06-22 ENCOUNTER — Encounter (HOSPITAL_COMMUNITY): Payer: Self-pay | Admitting: Radiology
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
## 2018-10-22 ENCOUNTER — Other Ambulatory Visit: Payer: Self-pay

## 2018-10-22 ENCOUNTER — Encounter (HOSPITAL_COMMUNITY): Payer: Self-pay

## 2018-10-22 ENCOUNTER — Emergency Department (HOSPITAL_COMMUNITY): Payer: Self-pay

## 2018-10-22 ENCOUNTER — Emergency Department (HOSPITAL_COMMUNITY)
Admission: EM | Admit: 2018-10-22 | Discharge: 2018-10-22 | Disposition: A | Discharge status: Home / Self Care | Payer: Self-pay | Attending: Emergency Medicine | Admitting: Emergency Medicine

## 2018-10-22 DIAGNOSIS — F17210 Nicotine dependence, cigarettes, uncomplicated: Secondary | ICD-10-CM | POA: Insufficient documentation

## 2018-10-22 DIAGNOSIS — Y929 Unspecified place or not applicable: Secondary | ICD-10-CM | POA: Insufficient documentation

## 2018-10-22 DIAGNOSIS — S71142D Puncture wound with foreign body, left thigh, subsequent encounter: Secondary | ICD-10-CM | POA: Insufficient documentation

## 2018-10-22 DIAGNOSIS — Z79899 Other long term (current) drug therapy: Secondary | ICD-10-CM | POA: Insufficient documentation

## 2018-10-22 DIAGNOSIS — Y999 Unspecified external cause status: Secondary | ICD-10-CM | POA: Insufficient documentation

## 2018-10-22 DIAGNOSIS — Z7901 Long term (current) use of anticoagulants: Secondary | ICD-10-CM | POA: Insufficient documentation

## 2018-10-22 DIAGNOSIS — S71142A Puncture wound with foreign body, left thigh, initial encounter: Secondary | ICD-10-CM

## 2018-10-22 DIAGNOSIS — Y939 Activity, unspecified: Secondary | ICD-10-CM | POA: Insufficient documentation

## 2018-10-22 NOTE — ED Notes (Signed)
Patient and belongings not found in room when attempting to d/c patient.

## 2018-10-22 NOTE — ED Notes (Signed)
Patient left prior to receiving d/c paperwork and vital recheck.
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
## 2018-10-22 NOTE — Discharge Instructions (Addendum)
Cover the area so not to get infected. Return for any problems.

## 2022-11-12 ENCOUNTER — Encounter (HOSPITAL_COMMUNITY): Payer: Self-pay

## 2022-11-12 ENCOUNTER — Emergency Department (HOSPITAL_COMMUNITY)
Admission: EM | Admit: 2022-11-12 | Discharge: 2022-11-12 | Disposition: A | Discharge status: Home / Self Care | Payer: Self-pay | Attending: Emergency Medicine | Admitting: Emergency Medicine

## 2022-11-12 DIAGNOSIS — R21 Rash and other nonspecific skin eruption: Secondary | ICD-10-CM | POA: Insufficient documentation

## 2022-11-12 LAB — HIV ANTIBODY (ROUTINE TESTING W REFLEX): HIV Screen 4th Generation wRfx: NONREACTIVE

## 2022-11-12 MED ORDER — HYDROXYZINE HCL 25 MG PO TABS: 25.0000 mg | ORAL_TABLET | Freq: Four times a day (QID) | ORAL | 0 refills | Status: AC

## 2022-11-12 MED ORDER — DEXAMETHASONE SODIUM PHOSPHATE 10 MG/ML IJ SOLN
10.0000 mg | Freq: Once | INTRAMUSCULAR | Status: AC
  Administered 2022-11-12: 10 mg via INTRAMUSCULAR
  Filled 2022-11-12: qty 1

## 2022-11-12 MED ORDER — METHYLPREDNISOLONE 4 MG PO TBPK: ORAL_TABLET | ORAL | 0 refills | Status: AC

## 2022-11-12 NOTE — ED Provider Notes (Signed)
[facility] EMERGENCY DEPARTMENT AT [facility] Provider Note   CSN: [id number] Arrival date & time: 11/12/22  1114     History  Chief Complaint  Patient presents with   Rash    Travis Livingston is a 52 y.o. male.  Patient with a diffuse, cirrhotic appearing rash over the face, scalp, palms of the hands, dorsum of the feet, medial soles of the feet going on for about 1 year and progressively worsening over the past month.  He states that it is intensely pruritic especially at night.  He states that he was tested for syphilis about a year and a half ago but has not been tested since he got this rash.  He has never seen a dermatologist for this.  The history is provided by the patient.  Rash Location:  Face, head/neck, hand and foot Head/neck rash location:  Scalp Facial rash location:  Face, L eyebrow, R eyebrow, L cheek, R cheek and nose Hand rash location:  L palm, L fingers, R palm and R fingers Foot rash location:  Top of L foot, top of R foot, sole of L foot and sole of R foot Severity:  Moderate Onset quality:  Gradual Duration:  12 months Timing:  Constant Progression:  Worsening Chronicity:  Recurrent Relieved by:  Nothing Worsened by:  Heat      Home Medications Prior to Admission medications   Medication Sig Start Date End Date Taking? Authorizing Provider  acetaminophen (TYLENOL) 325 MG tablet Take 3 tablets (975 mg total) by mouth every 6 (six) hours as needed for mild pain, fever or headache. 06/19/18   Saverio Danker, PA-C  docusate sodium (COLACE) 100 MG capsule Take 1 capsule (100 mg total) by mouth 2 (two) times daily. 06/19/18   Saverio Danker, PA-C  enoxaparin (LOVENOX) 40 MG/0.4ML injection Inject 0.4 mLs (40 mg total) into the skin daily. 06/20/18   Saverio Danker, PA-C  HYDROcodone-acetaminophen (NORCO/VICODIN) 5-325 MG tablet Take 1-2 tablets by mouth every 6 hours as needed for pain. 07/27/17   Mesner, Corene Cornea, MD  oxyCODONE 10 MG TABS Take 0.5-1  tablets (5-10 mg total) by mouth every 4 (four) hours as needed for severe pain. 06/19/18   Saverio Danker, PA-C      Allergies    Patient has no known allergies.    Review of Systems   Review of Systems  Skin:  Positive for rash.    Physical Exam Updated Vital Signs BP (!) 147/95 (BP Location: Right Arm)   Pulse 63   Temp 98.2 F (36.8 C) (Oral)   Resp 16   SpO2 100%  Physical Exam Vitals and nursing note reviewed.  Constitutional:      General: He is not in acute distress.    Appearance: He is well-developed. He is not diaphoretic.  HENT:     Head: Normocephalic and atraumatic.  Eyes:     General: No scleral icterus.    Conjunctiva/sclera: Conjunctivae normal.  Cardiovascular:     Rate and Rhythm: Normal rate and regular rhythm.     Heart sounds: Normal heart sounds.  Pulmonary:     Effort: Pulmonary effort is normal. No respiratory distress.     Breath sounds: Normal breath sounds.  Abdominal:     Palpations: Abdomen is soft.     Tenderness: There is no abdominal tenderness.  Musculoskeletal:     Cervical back: Normal range of motion and neck supple.  Skin:    General: Skin is warm and  dry.     Comments: Fuhs, scaly, xerotic plaquesNoted in the patient's beard and along the scalp line also along the palms.  He has, erythematous lesions over the nose that appear like very shallow ulcerations.  He has coin like darkened lesions over the medial soles of the feet.  Neurological:     Mental Status: He is alert.  Psychiatric:        Behavior: Behavior normal.     ED Results / Procedures / Treatments   Labs (all labs ordered are listed, but only abnormal results are displayed) Labs Reviewed - No data to display  EKG None  Radiology No results found.  Procedures Procedures    Medications Ordered in ED Medications - No data to display  ED Course/ Medical Decision Making/ A&P                             Medical Decision Making Patien  Amount and/or  Complexity of Data Reviewed Labs: ordered.  Risk Prescription drug management.   Patient here with abnormal diffuse rash.  I have discussed the potential for syphilis with the patient and he has agreed to receiving HIV and syphilis screening here.  I have also discussed the fact that he really needs to see a dermatologist and will try to treat the patient's symptoms for his diffuse itching which is worse at night with steroids.   Final Clinical Impression(s) / ED Diagnoses Final diagnoses:  Rash    Rx / DC Orders ED Discharge Orders     None         Margarita Mail, PA-C 11/12/22 1628    Gareth Morgan, MD 11/12/22 2315

## 2022-11-12 NOTE — ED Provider Triage Note (Signed)
Emergency Medicine Provider Triage Evaluation Note  Travis Livingston , a 52 y.o. male  was evaluated in triage.  Pt complains of concerns for rash diffusely throughout body. Notes that this has been ongoing for awhile. Tried OTC shea butter, hemp cream for his symptoms. Denies fever or drainage.   Review of Systems  Positive:  Negative:   Physical Exam  BP (!) 160/109 (BP Location: Left Arm)   Pulse 73   Temp 98.3 F (36.8 C) (Oral)   Resp 18   SpO2 100%  Gen:   Awake, no distress   Resp:  Normal effort  MSK:   Moves extremities without difficulty  Other:  Plaque like rash noted to dorsal aspect of bilateral hands. No appreciable drainage or erythema noted. No TTP noted to the area.   Medical Decision Making  Medically screening exam initiated at 11:54 AM.  Appropriate orders placed.  Travis Livingston was informed that the remainder of the evaluation will be completed by another provider, this initial triage assessment does not replace that evaluation, and the importance of remaining in the ED until their evaluation is complete.  Work-up initiated.    Reba Hulett A, PA-C 11/12/22 1155

## 2022-11-12 NOTE — ED Triage Notes (Signed)
Pt arrived via POV, c/o rash on face, arms, legs, multiple places on body for several months, some places for years.

## 2022-11-12 NOTE — Discharge Instructions (Addendum)
Your HIV and syphilis are currently pending.  You will be contacted if there is an abnormality however you may follow-up on the results on my chart.   These make sure to follow-up with a dermatologist soon as possible Take the medications I have prescribed for your itching. The hydroxyzine may make you very sleepy so this may be better to take at night before bed.

## 2022-11-13 LAB — RPR: RPR Ser Ql: NONREACTIVE

## 2022-12-19 ENCOUNTER — Emergency Department (HOSPITAL_COMMUNITY)
Admission: EM | Admit: 2022-12-19 | Discharge: 2022-12-19 | Disposition: A | Discharge status: Home / Self Care | Payer: 59 | Attending: Emergency Medicine | Admitting: Emergency Medicine

## 2022-12-19 ENCOUNTER — Other Ambulatory Visit: Payer: Self-pay

## 2022-12-19 ENCOUNTER — Encounter (HOSPITAL_COMMUNITY): Payer: Self-pay

## 2022-12-19 DIAGNOSIS — H9203 Otalgia, bilateral: Secondary | ICD-10-CM | POA: Diagnosis not present

## 2022-12-19 DIAGNOSIS — H60503 Unspecified acute noninfective otitis externa, bilateral: Secondary | ICD-10-CM | POA: Diagnosis not present

## 2022-12-19 MED ORDER — NEOMYCIN-COLIST-HC-THONZONIUM 3.3-3-10-0.5 MG/ML OT SUSP: 4.0000 [drp] | Freq: Two times a day (BID) | OTIC | Status: DC

## 2022-12-19 MED ORDER — NEOMYCIN-POLYMYXIN-HC 3.5-10000-1 OT SUSP: 4.0000 [drp] | Freq: Three times a day (TID) | OTIC | 0 refills | Status: DC

## 2022-12-19 MED ORDER — ACETAMINOPHEN 500 MG PO TABS
1000.0000 mg | ORAL_TABLET | Freq: Once | ORAL | Status: AC
  Administered 2022-12-19: 1000 mg via ORAL
  Filled 2022-12-19: qty 2

## 2022-12-19 MED ORDER — OFLOXACIN 0.3 % OP SOLN
5.0000 [drp] | Freq: Every day | OPHTHALMIC | Status: DC
  Filled 2022-12-19: qty 5

## 2022-12-19 NOTE — ED Provider Notes (Signed)
[place] Provider Note   CSN: [id number] Arrival date & time: 12/19/22  [id number]     History  Chief Complaint  Patient presents with   Otalgia    Travis Livingston is a 52 y.o. male.   Otalgia  Patient complains of bilateral ear pain for the past 2 weeks states that he has tried to clean them out with Q-tips there is odor and pus.  He states there has been good amount of drainage from both ears.  Denies any other symptoms no fevers nausea vomiting chest pain difficulty breathing lightheadedness or dizziness cough or congestion.       Home Medications Prior to Admission medications   Medication Sig Start Date End Date Taking? Authorizing Provider  acetaminophen (TYLENOL) 325 MG tablet Take 3 tablets (975 mg total) by mouth every 6 (six) hours as needed for mild pain, fever or headache. 06/19/18   Saverio Danker, PA-C  docusate sodium (COLACE) 100 MG capsule Take 1 capsule (100 mg total) by mouth 2 (two) times daily. 06/19/18   Saverio Danker, PA-C  enoxaparin (LOVENOX) 40 MG/0.4ML injection Inject 0.4 mLs (40 mg total) into the skin daily. 06/20/18   Saverio Danker, PA-C  HYDROcodone-acetaminophen (NORCO/VICODIN) 5-325 MG tablet Take 1-2 tablets by mouth every 6 hours as needed for pain. 07/27/17   Mesner, Corene Cornea, MD  hydrOXYzine (ATARAX) 25 MG tablet Take 1 tablet (25 mg total) by mouth every 6 (six) hours. 11/12/22   Margarita Mail, PA-C  methylPREDNISolone (MEDROL DOSEPAK) 4 MG TBPK tablet Use as directed 11/12/22   Margarita Mail, PA-C  oxyCODONE 10 MG TABS Take 0.5-1 tablets (5-10 mg total) by mouth every 4 (four) hours as needed for severe pain. 06/19/18   Saverio Danker, PA-C      Allergies    Patient has no known allergies.    Review of Systems   Review of Systems  HENT:  Positive for ear pain.     Physical Exam Updated Vital Signs BP (!) 140/95   Pulse 83   Temp 98.1 F (36.7 C)   Resp 17   Ht '5\' 8"'$  (1.727 m)   Wt 77.1 kg    SpO2 100%   BMI 25.85 kg/m  Physical Exam Vitals and nursing note reviewed.  Constitutional:      General: He is not in acute distress.    Appearance: Normal appearance. He is not ill-appearing.  HENT:     Head: Normocephalic and atraumatic.     Ears:     Comments: Bilateral TMs difficult to visualize due to macerated tissue and purulence and EAC.  I personally used a curette to remove detritus.  There is no significant EAC edema therefore no wick was placed Eyes:     General: No scleral icterus.       Right eye: No discharge.        Left eye: No discharge.     Conjunctiva/sclera: Conjunctivae normal.  Pulmonary:     Effort: Pulmonary effort is normal. No respiratory distress.     Breath sounds: No stridor.  Neurological:     Mental Status: He is alert and oriented to person, place, and time. Mental status is at baseline.     ED Results / Procedures / Treatments   Labs (all labs ordered are listed, but only abnormal results are displayed) Labs Reviewed - No data to display  EKG None  Radiology No results found.  Procedures Procedures    Medications  Ordered in ED Medications  ofloxacin (OCUFLOX) 0.3 % ophthalmic solution 5 drop (has no administration in time range)  acetaminophen (TYLENOL) tablet 1,000 mg (1,000 mg Oral Given 12/19/22 0339)    ED Course/ Medical Decision Making/ A&P Clinical Course as of 12/19/22 0459  Mon Dec 19, 2022  0319 R ear pain for 2 weeks but today was worse.  Both ears w drainage for 2 weeks -- states small amount of clear watery DC at times brown DC.   [WF]    Clinical Course User Index [WF] Tedd Sias, PA                             Medical Decision Making Risk OTC drugs. Prescription drug management.   Patient complains of bilateral ear pain for the past 2 weeks states that he has tried to clean them out with Q-tips there is odor and pus.  He states there has been good amount of drainage from both ears.  Denies any  other symptoms no fevers nausea vomiting chest pain difficulty breathing lightheadedness or dizziness cough or congestion.  Bilateral TMs difficult to visualize due to macerated tissue and purulence and EAC.  I personally used a curette to remove detritus.  There is no significant EAC edema therefore no wick was placed  Will use ofloxacin as this is safe with perforations I doubt the patient has a TM perforation as his hearing is symmetric and normal.  Patient feels improved after dose of Tylenol.  Patient provided ofloxacin by RN.  Will discharge home with follow-up with PCP.  Return precautions discussed  Final Clinical Impression(s) / ED Diagnoses Final diagnoses:  Acute otitis externa of both ears, unspecified type    Rx / DC Orders ED Discharge Orders          Ordered    neomycin-polymyxin-hydrocortisone (CORTISPORIN) 3.5-10000-1 OTIC suspension  3 times daily,   Status:  Discontinued        12/19/22 Fleming, Casson Catena S, PA 12/19/22 0459    Quintella Reichert, MD 12/19/22 [phone]

## 2022-12-19 NOTE — Discharge Instructions (Addendum)
Use the eardrops and both ears 5 drops twice daily for the next 7 days.   Please use Tylenol or ibuprofen for pain.  You may use 600 mg ibuprofen every 6 hours or 1000 mg of Tylenol every 6 hours.  You may choose to alternate between the 2.  This would be most effective.  Not to exceed 4 g of Tylenol within 24 hours.  Not to exceed 3200 mg ibuprofen 24 hours.   Follow-up with your primary care doctor if you do not have a primary care doctor follow-up with the [facility]

## 2022-12-19 NOTE — ED Triage Notes (Signed)
States that his right ear has been bothering him for a couple of week, states that we he tried to clean it out, there was a odor, and purulent drainage.

## 2023-01-31 ENCOUNTER — Emergency Department (HOSPITAL_COMMUNITY): Payer: 59

## 2023-01-31 ENCOUNTER — Other Ambulatory Visit: Payer: Self-pay

## 2023-01-31 ENCOUNTER — Encounter (HOSPITAL_COMMUNITY): Payer: Self-pay

## 2023-01-31 ENCOUNTER — Emergency Department (HOSPITAL_COMMUNITY)
Admission: EM | Admit: 2023-01-31 | Discharge: 2023-01-31 | Disposition: A | Discharge status: Home / Self Care | Payer: 59 | Attending: Emergency Medicine | Admitting: Emergency Medicine

## 2023-01-31 DIAGNOSIS — S63502A Unspecified sprain of left wrist, initial encounter: Secondary | ICD-10-CM | POA: Diagnosis not present

## 2023-01-31 DIAGNOSIS — Y9389 Activity, other specified: Secondary | ICD-10-CM | POA: Diagnosis not present

## 2023-01-31 DIAGNOSIS — X501XXA Overexertion from prolonged static or awkward postures, initial encounter: Secondary | ICD-10-CM | POA: Diagnosis not present

## 2023-01-31 DIAGNOSIS — M25532 Pain in left wrist: Secondary | ICD-10-CM | POA: Diagnosis present

## 2023-01-31 MED ORDER — IBUPROFEN 200 MG PO TABS
600.0000 mg | ORAL_TABLET | Freq: Once | ORAL | Status: AC
  Administered 2023-01-31: 600 mg via ORAL
  Filled 2023-01-31: qty 3

## 2023-01-31 MED ORDER — ACETAMINOPHEN 500 MG PO TABS
1000.0000 mg | ORAL_TABLET | Freq: Once | ORAL | Status: AC
  Administered 2023-01-31: 1000 mg via ORAL
  Filled 2023-01-31: qty 2

## 2023-01-31 NOTE — ED Triage Notes (Signed)
C/o left wrist pain and swelling  Full ROM noted in triage. + radial pulse Denies fall/trauma Ice and tylenol w/o relief

## 2023-01-31 NOTE — Discharge Instructions (Addendum)
You were seen in the emergency department for your wrist pain.  You had no signs of broken bones and no signs of injury to your nerves or blood vessels.  You likely sprained your wrist and I have given you an Ace wrap for support and to help with the swelling.  You should continue to ice your wrist and take Tylenol and Motrin as needed for pain.  You should avoid heavy lifting over 10 pounds for the next several days to help your wrist heal.  You should follow-up with your primary doctor to have your symptoms rechecked and for return to work instructions.  You should return to the emergency department for significantly worsening pain, numbness or weakness in your hand, fevers or if you have any other new or concerning symptoms.

## 2023-01-31 NOTE — ED Provider Notes (Signed)
[place] EMERGENCY DEPARTMENT AT [facility] Provider Note   CSN: [id number] Arrival date & time: 01/31/23  1425     History  Chief Complaint  Patient presents with   Wrist Pain    Gerard E Ganas is a 52 y.o. male.  Patient is a 52 year old male with no significant past medical history presenting to the emergency department with wrist pain.  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  He denies any numbness or weakness.  He states that he has been taking Tylenol for pain without significant relief.  The history is provided by the patient.  Wrist Pain       Home Medications Prior to Admission medications   Medication Sig Start Date End Date Taking? Authorizing Provider  acetaminophen (TYLENOL) 325 MG tablet Take 3 tablets (975 mg total) by mouth every 6 (six) hours as needed for mild pain, fever or headache. 06/19/18   Barnetta Chapel, PA-C  docusate sodium (COLACE) 100 MG capsule Take 1 capsule (100 mg total) by mouth 2 (two) times daily. 06/19/18   Barnetta Chapel, PA-C  enoxaparin (LOVENOX) 40 MG/0.4ML injection Inject 0.4 mLs (40 mg total) into the skin daily. 06/20/18   Barnetta Chapel, PA-C  HYDROcodone-acetaminophen (NORCO/VICODIN) 5-325 MG tablet Take 1-2 tablets by mouth every 6 hours as needed for pain. 07/27/17   Mesner, Barbara Cower, MD  hydrOXYzine (ATARAX) 25 MG tablet Take 1 tablet (25 mg total) by mouth every 6 (six) hours. 11/12/22   Arthor Captain, PA-C  methylPREDNISolone (MEDROL DOSEPAK) 4 MG TBPK tablet Use as directed 11/12/22   Arthor Captain, PA-C  oxyCODONE 10 MG TABS Take 0.5-1 tablets (5-10 mg total) by mouth every 4 (four) hours as needed for severe pain. 06/19/18   Barnetta Chapel, PA-C      Allergies    Patient has no known allergies.    Review of Systems   Review of Systems  Physical Exam Updated Vital Signs BP  (!) 142/100 (BP Location: Right Arm)   Pulse 72   Temp 98.5 F (36.9 C) (Oral)   Resp 18   Wt 77 kg   BMI 25.81 kg/m  Physical Exam Vitals and nursing note reviewed.  Constitutional:      General: He is not in acute distress.    Appearance: Normal appearance.  HENT:     Head: Normocephalic and atraumatic.     Nose: Nose normal.  Eyes:     Extraocular Movements: Extraocular movements intact.     Conjunctiva/sclera: Conjunctivae normal.  Cardiovascular:     Rate and Rhythm: Normal rate.     Pulses: Normal pulses.  Pulmonary:     Effort: Pulmonary effort is normal.  Musculoskeletal:        General: Normal range of motion.     Cervical back: Normal range of motion.     Comments: Mild tenderness and swelling to the left distal ulna, no snuffbox tenderness, no bony tenderness to left hand, wrist flexion/extension intact supination, pronation intact  Skin:    General: Skin is warm and dry.  Neurological:     General: No focal deficit present.     Mental Status: He is alert and oriented to person, place, and time.     Sensory: No sensory deficit.     Motor: No weakness.  Psychiatric:  Mood and Affect: Mood normal.        Behavior: Behavior normal.     ED Results / Procedures / Treatments   Labs (all labs ordered are listed, but only abnormal results are displayed) Labs Reviewed - No data to display  EKG None  Radiology DG Forearm Left  Result Date: 01/31/2023 CLINICAL DATA:  Left forearm pain and swelling.  No known trauma. EXAM: LEFT FOREARM - 2 VIEW COMPARISON:  None Available. FINDINGS: The wrist and elbow joints are maintained.  No acute bony findings. IMPRESSION: No acute bony findings. Electronically Signed   By: Rudie Meyer M.D.   On: 01/31/2023 16:51    Procedures Procedures    Medications Ordered in ED Medications  acetaminophen (TYLENOL) tablet 1,000 mg (1,000 mg Oral Given 01/31/23 1651)  ibuprofen (ADVIL) tablet 600 mg (600 mg Oral Given 01/31/23  1651)    ED Course/ Medical Decision Making/ A&P Clinical Course as of 01/31/23 1756  Tue Jan 31, 2023  1733 XR is negative. Patient is stable for discharge home with PCP follow up for wrist sprain. [VK]    Clinical Course User Index [VK] Rexford Maus, DO                             Medical Decision Making This patient presents to the ED with chief complaint(s) of L wrist pain with no pertinent past medical history which further complicates the presenting complaint. The complaint involves an extensive differential diagnosis and also carries with it a high risk of complications and morbidity.    The differential diagnosis includes fracture, dislocation, sprain, no overlying skin changes, no warmth and no decreased ROM making septic joint unlikely  Additional history obtained: Additional history obtained from N/A Records reviewed N/A  ED Course and Reassessment: Patient was neurovascularly intact and well-appearing on arrival no acute distress.  He does have some swelling and tenderness to palpation of his distal ulna and will have an x-ray performed to evaluate for fracture or bony abnormality.  He was given Tylenol, Motrin and ice pack for pain.  Independent labs interpretation:  N/A  Independent visualization of imaging: - I independently visualized the following imaging with scope of interpretation limited to determining acute life threatening conditions related to emergency care: L forearm XR, which revealed no acute disease  Consultation: - Consulted or discussed management/test interpretation w/ external professional: N/A  Consideration for admission or further workup: Patient has no emergent conditions requiring admission or further work-up at this time and is stable for discharge home with primary care follow-up  Social Determinants of health: N/A    Amount and/or Complexity of Data Reviewed Radiology: ordered.  Risk OTC drugs.          Final  Clinical Impression(s) / ED Diagnoses Final diagnoses:  Sprain of left wrist, initial encounter    Rx / DC Orders ED Discharge Orders     None         Rexford Maus, DO 01/31/23 1756
